# Patient Record
Sex: Male | Born: 1975 | Race: White | Hispanic: No | Marital: Married | Smoking: Never smoker
Health system: Southern US, Community
[De-identification: ages and names within clinical notes are randomized; demographics above are authoritative.]

---

## 2004-06-14 ENCOUNTER — Ambulatory Visit (HOSPITAL_COMMUNITY)
Admission: RE | Admit: 2004-06-14 | Discharge: 2004-06-14 | Payer: Self-pay | Admitting: Physical Medicine & Rehabilitation

## 2004-10-28 ENCOUNTER — Emergency Department (HOSPITAL_COMMUNITY): Admission: EM | Admit: 2004-10-28 | Discharge: 2004-10-28 | Payer: Self-pay | Admitting: Emergency Medicine

## 2007-10-19 ENCOUNTER — Emergency Department (HOSPITAL_COMMUNITY): Admission: EM | Admit: 2007-10-19 | Discharge: 2007-10-20 | Payer: Self-pay | Admitting: Emergency Medicine

## 2007-11-03 ENCOUNTER — Ambulatory Visit (HOSPITAL_BASED_OUTPATIENT_CLINIC_OR_DEPARTMENT_OTHER): Admission: RE | Admit: 2007-11-03 | Discharge: 2007-11-03 | Payer: Self-pay | Admitting: Urology

## 2008-09-23 IMAGING — CR DG CHEST 2V
2 series · 2 of 2 positions shown · non-contrast
Comparison: None

CLINICAL DATA: Preoperative respiratory exam for urological
procedure.

CHEST - 2 VIEW

[view not recorded (1 of 2)]
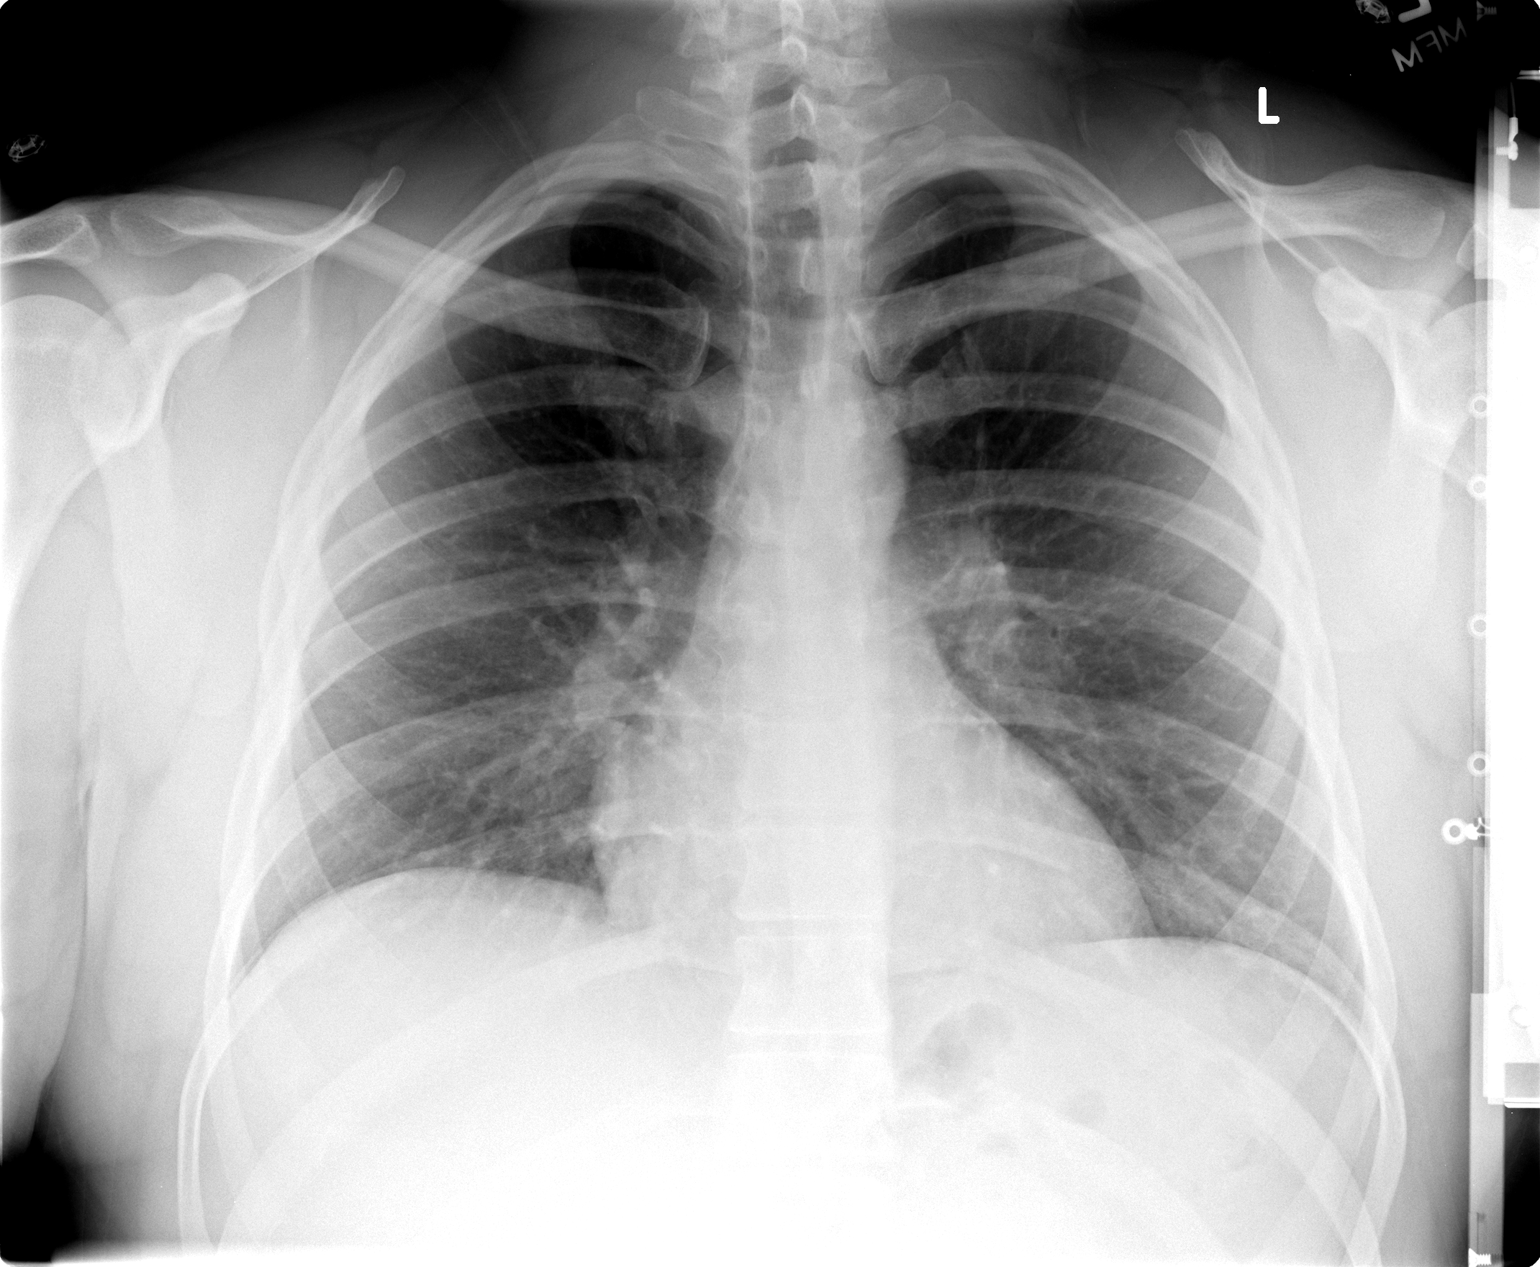

[view not recorded (2 of 2)]
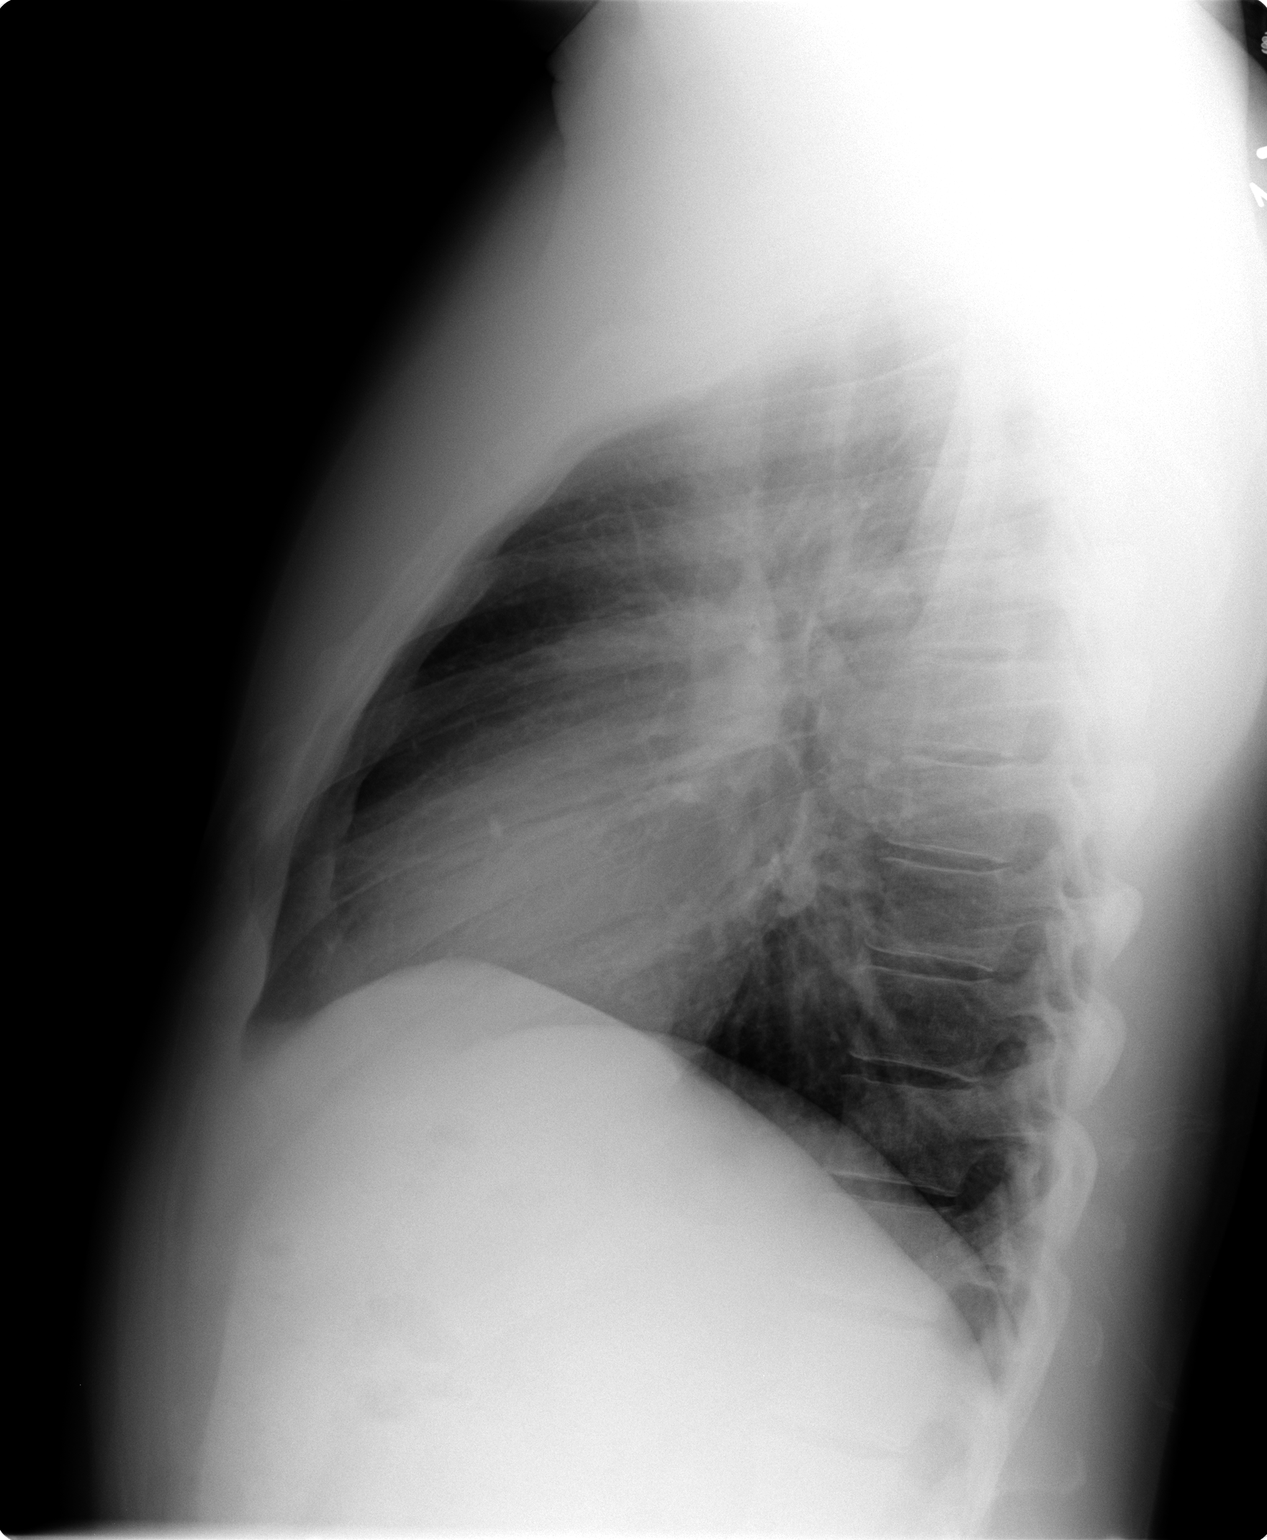

[2 of 2 positions shown; findings below may reference images not displayed]

FINDINGS: Heart size is normal.  The mediastinum is unremarkable.
Lungs are clear.  No effusions.  No bony abnormalities.
IMPRESSION: Normal chest

## 2010-09-10 NOTE — Op Note (Signed)
NAME:  Randall Arellano, Randall Arellano               ACCOUNT NO.:  [id number]   MEDICAL RECORD NO.:  [id number]          PATIENT TYPE:  AMB   LOCATION:  [facility]                         FACILITY:  [facility]   PHYSICIAN:  Sigmund I. Patsi Sears, M.D.DATE OF BIRTH:  1975/11/15   DATE OF PROCEDURE:  11/03/2007  DATE OF DISCHARGE:                               OPERATIVE REPORT   PREOPERATIVE DIAGNOSIS:  Left distal ureteral calculus.   POSTOPERATIVE DIAGNOSIS:  Normal left distal ureter with calcification  outside the ureter.   OPERATION:  Cystourethroscopy, left retrograde pyelogram with  interpretation, left ureteroscopy.   SURGEON:  Sigmund I. Patsi Sears, M.D.   ANESTHESIA:  General LMA.   PREPARATION:  After appropriate preanesthesia, the patient is brought to  the operating room, placed on the operating table in the dorsal supine  position where general LMA anesthesia was induced.  The patient's  history is as follows.  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  The patient was felt to  have a 7 mm left distal ureteral calculus, which he has not passed.  He  is no more symptoms, however.   In view of the CT scan from the emergency room shows that the patient  indeed has a 7 x 3.5 mm distal left ureteral stone, with moderate  hydronephrosis from October 19, 2007.   PROCEDURE:  Cystourethroscopy was accomplished after the patient was  brought to the operating room, placed on the operating table in the  dorsal supine position.  The left side was marked.  Time-out was  accomplished at the beginning of the case.  The patient underwent left-  sided x-ray, which shows that there is a 7 mm calcification in the left  lower quadrant, in the area described by the CT scan, medial to the  acetabular rim.  Retrograde pyelogram was performed, which shows a  normal-appearing ureter, which courses medial to  the calcification, it  does not appeared to be intimately associated with ureter.   Direct ureteroscopy is accomplished with the short semi-rigid scope, and  ureteroscopy shows a normal-appearing ureter.  Retrograde pyelography  was accomplished which shows a normal-appearing ureter and no  hydronephrosis.  Evaluation of the renal pelvis shows no evidence of  stone within the renal pelvis.  The patient was given IV Toradol,  awakened and taken to the recovery room in good condition.      Sigmund I. Patsi Sears, M.D.  Electronically Signed     SIT/MEDQ  D:  11/03/2007  T:  11/03/2007  Job:  [id number]

## 2010-12-07 ENCOUNTER — Encounter: Payer: Self-pay | Admitting: *Deleted

## 2010-12-07 ENCOUNTER — Emergency Department (HOSPITAL_BASED_OUTPATIENT_CLINIC_OR_DEPARTMENT_OTHER)
Admission: EM | Admit: 2010-12-07 | Discharge: 2010-12-07 | Disposition: A | Discharge status: Home / Self Care | Payer: 59 | Attending: Emergency Medicine | Admitting: Emergency Medicine

## 2010-12-07 DIAGNOSIS — R1032 Left lower quadrant pain: Secondary | ICD-10-CM | POA: Insufficient documentation

## 2010-12-07 LAB — CBC
MCH: 30.5 pg (ref 26.0–34.0)
MCV: 85.2 fL (ref 78.0–100.0)
Platelets: 271 10*3/uL (ref 150–400)
RDW: 12.8 % (ref 11.5–15.5)
WBC: 8.3 10*3/uL (ref 4.0–10.5)

## 2010-12-07 LAB — URINALYSIS, ROUTINE W REFLEX MICROSCOPIC
Bilirubin Urine: NEGATIVE
Glucose, UA: NEGATIVE mg/dL
Hgb urine dipstick: NEGATIVE
Specific Gravity, Urine: 1.026 (ref 1.005–1.030)
pH: 5.5 (ref 5.0–8.0)

## 2010-12-07 LAB — DIFFERENTIAL
Basophils Absolute: 0.1 10*3/uL (ref 0.0–0.1)
Eosinophils Absolute: 0.1 10*3/uL (ref 0.0–0.7)
Eosinophils Relative: 1 % (ref 0–5)
Lymphocytes Relative: 43 % (ref 12–46)
Neutrophils Relative %: 47 % (ref 43–77)

## 2010-12-07 LAB — COMPREHENSIVE METABOLIC PANEL
ALT: 60 U/L — ABNORMAL HIGH (ref 0–53)
AST: 41 U/L — ABNORMAL HIGH (ref 0–37)
Calcium: 10 mg/dL (ref 8.4–10.5)
Sodium: 141 mEq/L (ref 135–145)
Total Protein: 8.4 g/dL — ABNORMAL HIGH (ref 6.0–8.3)

## 2010-12-07 MED ORDER — METRONIDAZOLE 500 MG PO TABS: 500.0000 mg | ORAL_TABLET | Freq: Once | ORAL | Status: DC

## 2010-12-07 MED ORDER — METRONIDAZOLE 500 MG PO TABS: 500.0000 mg | ORAL_TABLET | Freq: Three times a day (TID) | ORAL | Status: AC

## 2010-12-07 MED ORDER — CIPROFLOXACIN HCL 500 MG PO TABS: 500.0000 mg | ORAL_TABLET | Freq: Once | ORAL | Status: DC

## 2010-12-07 MED ORDER — HYDROCODONE-ACETAMINOPHEN 5-325 MG PO TABS: 2.0000 | ORAL_TABLET | Freq: Once | ORAL | Status: DC

## 2010-12-07 MED ORDER — METRONIDAZOLE 500 MG PO TABS
500.0000 mg | ORAL_TABLET | Freq: Once | ORAL | Status: AC
  Administered 2010-12-07: 500 mg via ORAL
  Filled 2010-12-07: qty 1

## 2010-12-07 MED ORDER — HYDROCODONE-ACETAMINOPHEN 5-325 MG PO TABS
2.0000 | ORAL_TABLET | Freq: Once | ORAL | Status: AC
  Administered 2010-12-07: 2 via ORAL
  Filled 2010-12-07: qty 2

## 2010-12-07 MED ORDER — HYDROCODONE-ACETAMINOPHEN 5-325 MG PO TABS: 2.0000 | ORAL_TABLET | Freq: Four times a day (QID) | ORAL | Status: AC | PRN

## 2010-12-07 MED ORDER — CIPROFLOXACIN HCL 500 MG PO TABS
500.0000 mg | ORAL_TABLET | Freq: Once | ORAL | Status: AC
  Administered 2010-12-07: 500 mg via ORAL
  Filled 2010-12-07: qty 1

## 2010-12-07 MED ORDER — CIPROFLOXACIN HCL 500 MG PO TABS: 500.0000 mg | ORAL_TABLET | Freq: Two times a day (BID) | ORAL | Status: AC

## 2010-12-07 NOTE — ED Provider Notes (Signed)
History     CSN: [id number] Arrival date & time: 12/07/2010  4:31 AM  Chief Complaint  Patient presents with  . Abdominal Pain   HPI Complains of abdominal pain onset 3 days ago. No fever. No anorexia. No treatment prior to coming here .pain is left-sided. Nonradiating. Similar to "intestinal infection" he said in the past which was treated with antibiotics and resolved. Patient reports diarrhea 3 days ago which resolved last bowel movement yesterday, normal pain worse with palpation not improved by anything presently 8 on scale of 1-10. Pain does not feel like kidney stone he's had before.  History reviewed. No pertinent past medical history.  History reviewed. No pertinent past surgical history.  No family history on file.  History  Substance Use Topics  . Smoking status: Never Smoker   . Smokeless tobacco: Not on file  . Alcohol Use: Yes   History of kidney stone   Review of Systems  Constitutional: Negative.   HENT: Negative.   Respiratory: Negative.   Cardiovascular: Negative.   Gastrointestinal: Positive for abdominal pain.  Musculoskeletal: Negative.   Skin: Negative.   Neurological: Negative.   Hematological: Negative.   Psychiatric/Behavioral: Negative.     Physical Exam  BP 123/75  Pulse 78  Temp(Src) 97.8 F (36.6 C) (Oral)  Resp 16  Ht 5\' 9"  (1.753 m)  Wt 220 lb (99.791 kg)  BMI 32.49 kg/m2  SpO2 97%  Physical Exam  Constitutional: He appears well-developed and well-nourished.  HENT:  Head: Normocephalic and atraumatic.  Eyes: Conjunctivae are normal. Pupils are equal, round, and reactive to light.  Neck: Neck supple. No tracheal deviation present. No thyromegaly present.  Cardiovascular: Normal rate and regular rhythm.   No murmur heard. Pulmonary/Chest: Effort normal and breath sounds normal.  Abdominal: Soft. Bowel sounds are normal. He exhibits no distension. There is no tenderness. There is no CVA tenderness. No hernia.     Musculoskeletal: Normal range of motion. He exhibits no edema and no tenderness.  Neurological: He is alert. Coordination normal.  Skin: Skin is warm and dry. No rash noted.  Psychiatric: He has a normal mood and affect.   genitalia normal circumcised male  ED Course  Procedures  MDM In light of no peritonitis, previous symptoms treated with antibiotics and improved Will treat empirically with Cipro and Flagyl as well as hydrocodone for pain patient is instructed to stay on clear liquids for 24 hours follow up with Dr. Adonis Housekeeper if not improved in 2 days or return. Discussed CT scan with patient and spouse, they declined. Differential diagnosis includes diverticulitis, colitis, nonspecific      Doug Sou, MD 12/07/10 [phone]

## 2010-12-07 NOTE — ED Notes (Signed)
Pt c/o lower left abdominal pains that began Wednesday night with diarrhea Wednesday only and constipation since. Pt sts he had an intestinal infection 5-6 years ago and this pain feels the same.

## 2011-01-23 LAB — URINALYSIS, ROUTINE W REFLEX MICROSCOPIC
Glucose, UA: NEGATIVE
Hgb urine dipstick: NEGATIVE
Specific Gravity, Urine: 1.005
Urobilinogen, UA: 0.2

## 2011-01-23 LAB — POCT HEMOGLOBIN-HEMACUE
Hemoglobin: 14.5
Operator id: 280881

## 2016-06-20 ENCOUNTER — Emergency Department (HOSPITAL_COMMUNITY)
Admission: EM | Admit: 2016-06-20 | Discharge: 2016-06-20 | Disposition: A | Discharge status: Home / Self Care | Payer: Worker's Compensation | Attending: Emergency Medicine | Admitting: Emergency Medicine

## 2016-06-20 ENCOUNTER — Encounter (HOSPITAL_COMMUNITY): Payer: Self-pay | Admitting: Emergency Medicine

## 2016-06-20 DIAGNOSIS — Y999 Unspecified external cause status: Secondary | ICD-10-CM | POA: Diagnosis not present

## 2016-06-20 DIAGNOSIS — Y939 Activity, unspecified: Secondary | ICD-10-CM | POA: Insufficient documentation

## 2016-06-20 DIAGNOSIS — W268XXA Contact with other sharp object(s), not elsewhere classified, initial encounter: Secondary | ICD-10-CM | POA: Diagnosis not present

## 2016-06-20 DIAGNOSIS — S81012A Laceration without foreign body, left knee, initial encounter: Secondary | ICD-10-CM

## 2016-06-20 DIAGNOSIS — Y929 Unspecified place or not applicable: Secondary | ICD-10-CM | POA: Diagnosis not present

## 2016-06-20 MED ORDER — LIDOCAINE-EPINEPHRINE (PF) 2 %-1:200000 IJ SOLN
20.0000 mL | Freq: Once | INTRAMUSCULAR | Status: AC
  Administered 2016-06-20: 20 mL via INTRADERMAL
  Filled 2016-06-20: qty 20

## 2016-06-20 NOTE — ED Triage Notes (Signed)
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  Pt has lac to left knee.  Bleeding controlled at this time.

## 2016-06-20 NOTE — ED Provider Notes (Signed)
[facility]-EMERGENCY DEPT Provider Note   CSN: [id number] Arrival date & time: 06/20/16  1603   By signing my name below, I, [name], attest that this documentation has been prepared under the direction and in the presence of  Terance HartKelly Cambry Spampinato, PA-C. Electronically Signed: [name], ED Scribe. 06/20/16. 5:01 PM.   History   Chief Complaint Chief Complaint  Patient presents with  . Extremity Laceration    The history is provided by the patient. No language interpreter was used.   HPI Comments:  Randall Arellano is a 41 y.o. male who presents to the Emergency Department complaining of acute onset laceration to his left knee s/p an incident which occurred PTA. 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 He states he does not feel any residual glass in his wound. No alleviating or exacerbating factors noted. Pt denies gait problem, numbness or any other associated symptom. No known drug allergies noted. Tetanus up to date.   History reviewed. No pertinent past medical history.  There are no active problems to display for this patient.   History reviewed. No pertinent surgical history.   Home Medications    Prior to Admission medications   Not on File    Family History No family history on file.  Social History Social History  Substance Use Topics  . Smoking status: Never Smoker  . Smokeless tobacco: Never Used  . Alcohol use Yes     Allergies   Patient has no known allergies.   Review of Systems Review of Systems  Musculoskeletal: Positive for myalgias.  Skin: Positive for wound.  Neurological: Negative for numbness.   Physical Exam Updated Vital Signs BP 171/88 (BP Location: Right Arm)   Pulse 105   Temp 98.7 F (37.1 C) (Oral)   Resp 18   SpO2 98%   Physical Exam  Constitutional: He is oriented to person, place, and time. He appears well-developed and well-nourished. No distress.  HENT:  Head:  Normocephalic and atraumatic.  Eyes: Conjunctivae are normal.  Cardiovascular: Normal rate.   Pulmonary/Chest: Effort normal.  Abdominal: He exhibits no distension.  Musculoskeletal: Normal range of motion.  3 cm laceration to the anterior portion of the left knee. Bleeding controlled. No glass visualized after wound was anesthetized. FROM of the left knee. Pt is ambulatory.   Neurological: He is alert and oriented to person, place, and time.  Skin: Skin is warm and dry.  Psychiatric: He has a normal mood and affect.  Nursing note and vitals reviewed.   ED Treatments / Results  DIAGNOSTIC STUDIES:  Oxygen Saturation is 98% on RA, normal by my interpretation.    COORDINATION OF CARE:  4:56 PM Discussed treatment plan with pt at bedside and pt agreed to plan.  Labs (all labs ordered are listed, but only abnormal results are displayed) Labs Reviewed - No data to display  EKG  EKG Interpretation None       Radiology No results found.  Procedures Procedures (including critical care time) LACERATION REPAIR Performed by: Terance HartKelly Shaquavia Whisonant, PA-C Consent: Verbal consent obtained. Risks and benefits: risks, benefits and alternatives were discussed Patient identity confirmed: provided demographic data Time out performed prior to procedure Prepped and Draped in normal sterile fashion Wound explored Laceration Location: left klnee Laceration Length: 3 cm No Foreign Bodies seen or palpated Anesthesia: local infiltration Local anesthetic: lidocaine 2% with epinephrine Anesthetic total: 5 ml Irrigation method: syringe Amount of cleaning: standard Skin  closure: 4-0 prolene Number of sutures or staples: 4 Technique: simple interrupted  Patient tolerance: Patient tolerated the procedure well with no immediate complications.  Medications Ordered in ED Medications  lidocaine-EPINEPHrine (XYLOCAINE W/EPI) 2 %-1:200000 (PF) injection 20 mL (20 mLs Intradermal Given by Other 06/20/16  1636)     Initial Impression / Assessment and Plan / ED Course  I have reviewed the triage vital signs and the nursing notes.  Pertinent labs & imaging results that were available during my care of the patient were reviewed by me and considered in my medical decision making (see chart for details).  40 year old male presents with laceration over left knee after work related injury. Tetanus UTD. Laceration occurred < 12 hours prior to repair. Patient tolerated procedure well. 4 Prolene stiches placed. Bottom of wound was visualized and irrigated. No glass was visualized. Discussed laceration care with pt and answered questions. Pt to f-u for suture removal in 10 days and wound check sooner should there be signs of dehiscence or infection. Pt is hemodynamically stable with no complaints prior to dc.    Final Clinical Impressions(s) / ED Diagnoses   Final diagnoses:  Knee laceration, left, initial encounter    New Prescriptions New Prescriptions   No medications on file   I personally performed the services described in this documentation, which was scribed in my presence. The recorded information has been reviewed and is accurate.    Bethel Born, PA-C 06/20/16 1800    Alvira Monday, MD 06/21/16 1159

## 2016-06-20 NOTE — Discharge Instructions (Signed)
Keep wound clean and dry. Use soap and water to cleanse Change bandage daily Watch for signs of infection (redness, drainage, increased pain) Remove suture in 10 days (there are 4 total) Return for worsening symptoms or concerns

## 2017-01-13 ENCOUNTER — Other Ambulatory Visit: Payer: Self-pay | Admitting: Occupational Medicine

## 2017-01-13 ENCOUNTER — Ambulatory Visit
Admission: RE | Admit: 2017-01-13 | Discharge: 2017-01-13 | Disposition: A | Discharge status: Home / Self Care | Payer: No Typology Code available for payment source | Source: Ambulatory Visit | Attending: Occupational Medicine | Admitting: Occupational Medicine

## 2017-01-13 DIAGNOSIS — Z0289 Encounter for other administrative examinations: Secondary | ICD-10-CM

## 2017-12-04 IMAGING — CR DG CHEST 1V
1 series · 1 of 1 positions shown · non-contrast
Comparison: Chest x-ray of 11/03/2007

CLINICAL DATA: Annual physical exam

EXAM:
CHEST 1 VIEW

[w chest pa]
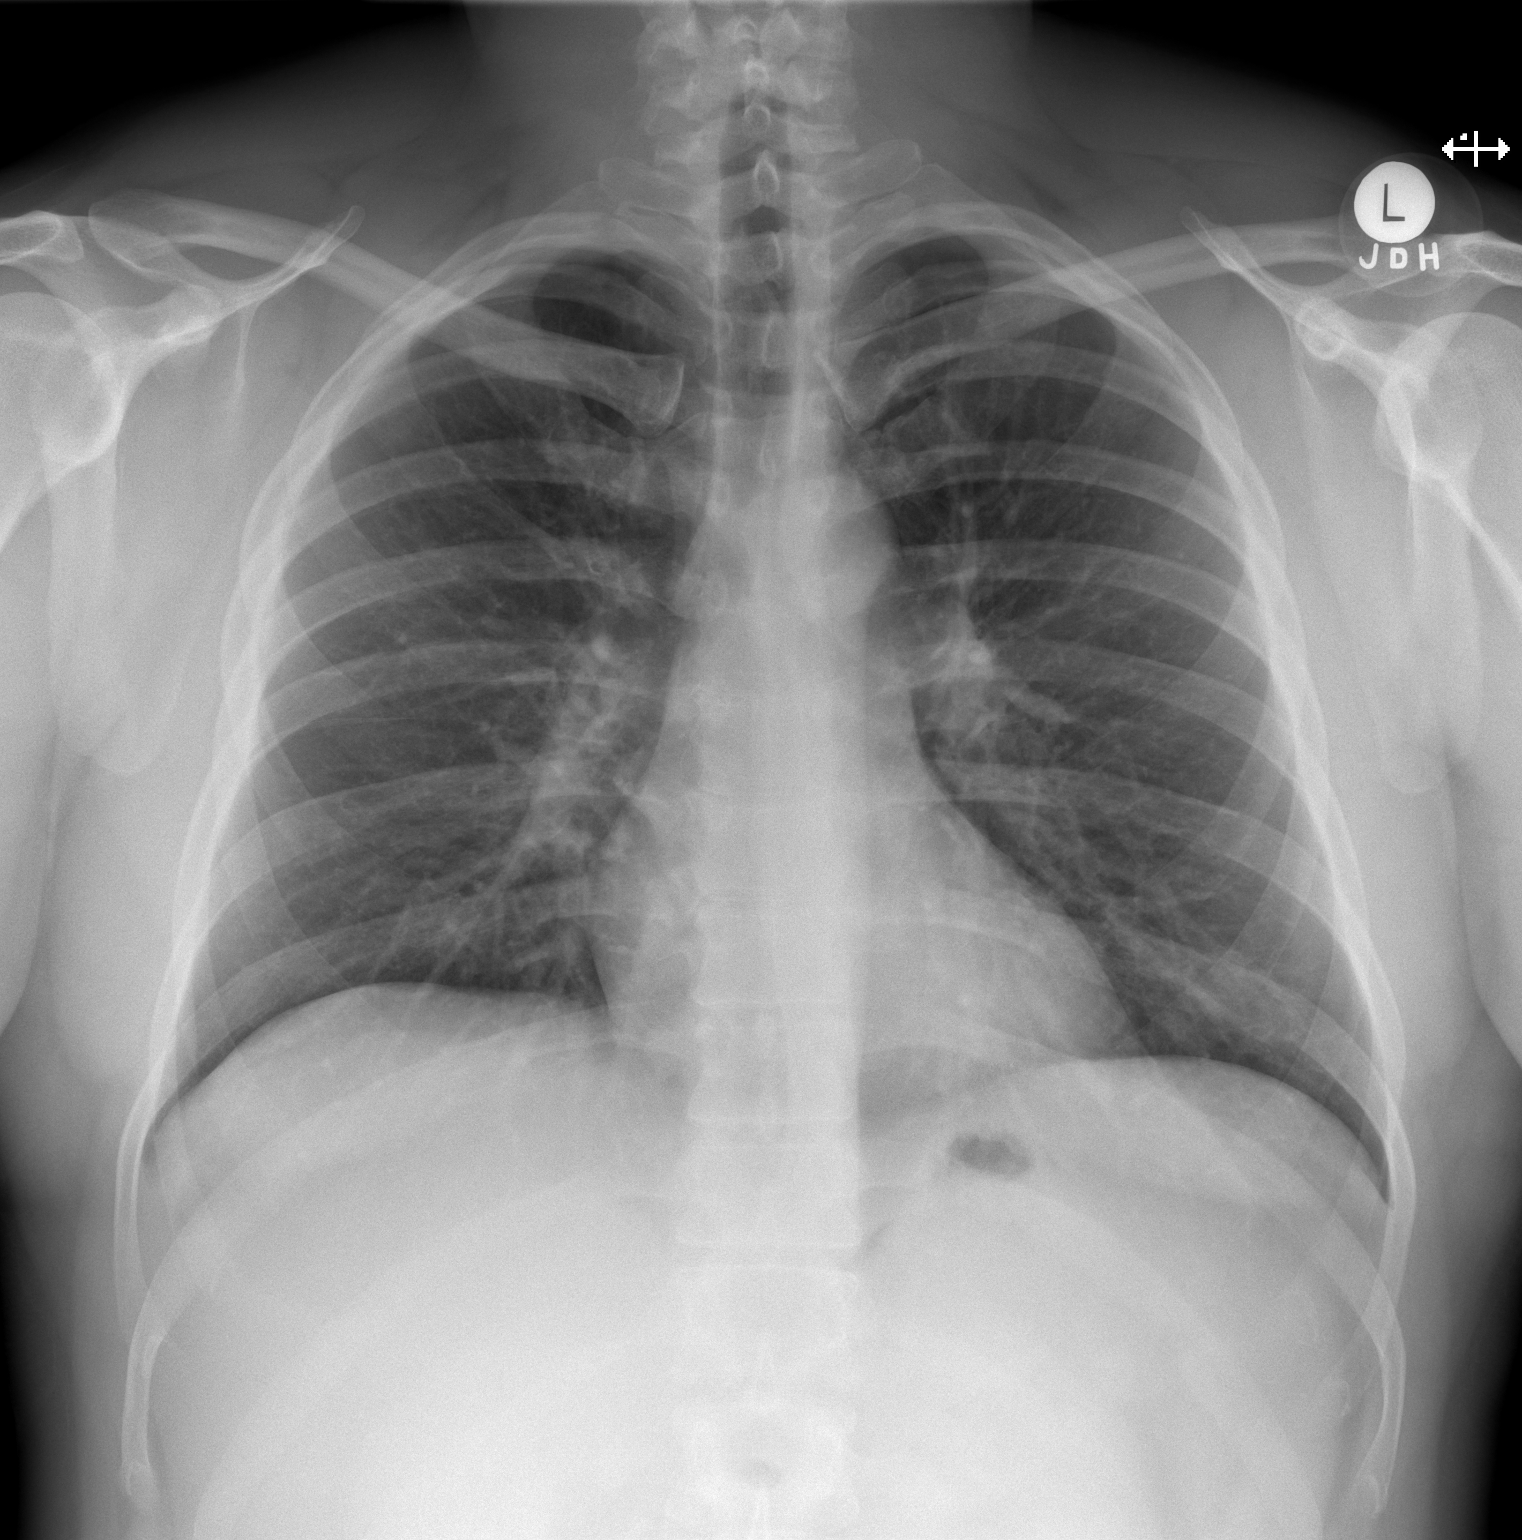

[1 of 1 positions shown; findings below may reference images not displayed]

FINDINGS: No active infiltrate or effusion is seen. Mediastinal and hilar
contours are unremarkable. The heart is within normal limits in
size. No bony abnormality is seen.
IMPRESSION: No active disease.

## 2018-10-23 ENCOUNTER — Other Ambulatory Visit: Payer: Self-pay | Admitting: Family Medicine

## 2018-10-23 DIAGNOSIS — Z20822 Contact with and (suspected) exposure to covid-19: Secondary | ICD-10-CM

## 2018-10-29 LAB — NOVEL CORONAVIRUS, NAA: SARS-CoV-2, NAA: NOT DETECTED

## 2018-11-11 ENCOUNTER — Telehealth: Payer: Self-pay

## 2018-11-11 NOTE — Telephone Encounter (Signed)
Incoming call from Patient requesting Covid-19 lab results .  Provided lab results voices understanding.
